# Patient Record
Sex: Female | Born: 1975 | Race: White | Hispanic: No | Marital: Single | Smoking: Former smoker
Health system: Southern US, Community
[De-identification: ages and names within clinical notes are randomized; demographics above are authoritative.]

## PROBLEM LIST (undated history)

## (undated) HISTORY — PX: TONSILLECTOMY: SUR1361

---

## 2017-03-23 ENCOUNTER — Emergency Department
Admission: EM | Admit: 2017-03-23 | Discharge: 2017-03-23 | Disposition: A | Discharge status: Home / Self Care | Payer: Self-pay | Attending: Emergency Medicine | Admitting: Emergency Medicine

## 2017-03-23 ENCOUNTER — Other Ambulatory Visit: Payer: Self-pay

## 2017-03-23 ENCOUNTER — Encounter: Payer: Self-pay | Admitting: Emergency Medicine

## 2017-03-23 ENCOUNTER — Emergency Department: Payer: Self-pay

## 2017-03-23 DIAGNOSIS — Z87891 Personal history of nicotine dependence: Secondary | ICD-10-CM | POA: Insufficient documentation

## 2017-03-23 DIAGNOSIS — N12 Tubulo-interstitial nephritis, not specified as acute or chronic: Secondary | ICD-10-CM | POA: Insufficient documentation

## 2017-03-23 LAB — URINALYSIS, COMPLETE (UACMP) WITH MICROSCOPIC
Bilirubin Urine: NEGATIVE
GLUCOSE, UA: NEGATIVE mg/dL
KETONES UR: NEGATIVE mg/dL
Nitrite: NEGATIVE
PROTEIN: NEGATIVE mg/dL
RBC / HPF: NONE SEEN RBC/hpf (ref 0–5)
Specific Gravity, Urine: 1.002 — ABNORMAL LOW (ref 1.005–1.030)
pH: 7 (ref 5.0–8.0)

## 2017-03-23 LAB — CBC
HEMATOCRIT: 38.6 % (ref 35.0–47.0)
HEMOGLOBIN: 13.2 g/dL (ref 12.0–16.0)
MCH: 31.6 pg (ref 26.0–34.0)
MCHC: 34.2 g/dL (ref 32.0–36.0)
MCV: 92.4 fL (ref 80.0–100.0)
Platelets: 215 10*3/uL (ref 150–440)
RBC: 4.18 MIL/uL (ref 3.80–5.20)
RDW: 12.6 % (ref 11.5–14.5)
WBC: 13.7 10*3/uL — ABNORMAL HIGH (ref 3.6–11.0)

## 2017-03-23 LAB — BASIC METABOLIC PANEL
ANION GAP: 11 (ref 5–15)
BUN: 8 mg/dL (ref 6–20)
CALCIUM: 8.9 mg/dL (ref 8.9–10.3)
CO2: 24 mmol/L (ref 22–32)
Chloride: 99 mmol/L — ABNORMAL LOW (ref 101–111)
Creatinine, Ser: 0.78 mg/dL (ref 0.44–1.00)
Glucose, Bld: 141 mg/dL — ABNORMAL HIGH (ref 65–99)
POTASSIUM: 3.9 mmol/L (ref 3.5–5.1)
Sodium: 134 mmol/L — ABNORMAL LOW (ref 135–145)

## 2017-03-23 LAB — POCT PREGNANCY, URINE: PREG TEST UR: NEGATIVE

## 2017-03-23 LAB — LACTIC ACID, PLASMA: LACTIC ACID, VENOUS: 0.7 mmol/L (ref 0.5–1.9)

## 2017-03-23 MED ORDER — TRAMADOL HCL 50 MG PO TABS: 50.0000 mg | ORAL_TABLET | Freq: Four times a day (QID) | ORAL | 0 refills | Status: AC | PRN

## 2017-03-23 MED ORDER — CIPROFLOXACIN HCL 500 MG PO TABS
500.0000 mg | ORAL_TABLET | Freq: Once | ORAL | Status: AC
  Administered 2017-03-23: 500 mg via ORAL

## 2017-03-23 MED ORDER — ACETAMINOPHEN 325 MG PO TABS
650.0000 mg | ORAL_TABLET | Freq: Once | ORAL | Status: AC | PRN
  Administered 2017-03-23: 650 mg via ORAL
  Filled 2017-03-23: qty 2

## 2017-03-23 MED ORDER — SODIUM CHLORIDE 0.9 % IV SOLN
1000.0000 mL | Freq: Once | INTRAVENOUS | Status: AC
  Administered 2017-03-23: 1000 mL via INTRAVENOUS

## 2017-03-23 MED ORDER — TRAMADOL HCL 50 MG PO TABS
ORAL_TABLET | ORAL | Status: AC
  Filled 2017-03-23: qty 1

## 2017-03-23 MED ORDER — DEXTROSE 5 % IV SOLN: 1.0000 g | Freq: Once | INTRAVENOUS | Status: DC

## 2017-03-23 MED ORDER — ONDANSETRON 4 MG PO TBDP: 4.0000 mg | ORAL_TABLET | Freq: Three times a day (TID) | ORAL | 0 refills | Status: AC | PRN

## 2017-03-23 MED ORDER — KETOROLAC TROMETHAMINE 30 MG/ML IJ SOLN
30.0000 mg | Freq: Once | INTRAMUSCULAR | Status: AC
  Administered 2017-03-23: 30 mg via INTRAVENOUS
  Filled 2017-03-23: qty 1

## 2017-03-23 MED ORDER — TRAMADOL HCL 50 MG PO TABS
50.0000 mg | ORAL_TABLET | Freq: Once | ORAL | Status: AC
  Administered 2017-03-23: 50 mg via ORAL

## 2017-03-23 MED ORDER — CIPROFLOXACIN HCL 500 MG PO TABS: 500.0000 mg | ORAL_TABLET | Freq: Two times a day (BID) | ORAL | 0 refills | Status: AC

## 2017-03-23 MED ORDER — CIPROFLOXACIN HCL 500 MG PO TABS
ORAL_TABLET | ORAL | Status: AC
  Filled 2017-03-23: qty 1

## 2017-03-23 NOTE — ED Notes (Signed)
Patient with complaint of fever, vomiting and bilateral lower back pain that started on Thursday. Patient denies any pain with urination. Patient reports vomiting times two today.

## 2017-03-23 NOTE — ED Provider Notes (Signed)
[facility] Emergency Department Provider Note   ____________________________________________    I have reviewed the triage vital signs and the nursing notes.   HISTORY  Chief Complaint Flank Pain     HPI Brittney Meadows is a 42 y.o. female who presents with complaints of flank pain and fever.  Patient reports flank pain began 2 or 3 days ago, she describes it as sharp and intermittent without radiation.  She has never had this before.  She denies dysuria.  No frequency.  No history of kidney stones.  Noted that she had fever today.  Occasional dry cough, no shortness of breath.  No rash.  No upper respiratory infection.  She reports mold in her household.  No vaginal discharge   History reviewed. No pertinent past medical history.  There are no active problems to display for this patient.   Past Surgical History:  Procedure Laterality Date  . TONSILLECTOMY      Prior to Admission medications   Medication Sig Start Date End Date Taking? Authorizing Provider  ciprofloxacin (CIPRO) 500 MG tablet Take 1 tablet (500 mg total) by mouth 2 (two) times daily for 10 days. 03/23/17 04/02/17  Jene EveryKinner, Demosthenes Virnig, MD  ondansetron (ZOFRAN ODT) 4 MG disintegrating tablet Take 1 tablet (4 mg total) by mouth every 8 (eight) hours as needed for nausea or vomiting. 03/23/17   Jene EveryKinner, Malikhi Ogan, MD  traMADol (ULTRAM) 50 MG tablet Take 1 tablet (50 mg total) by mouth every 6 (six) hours as needed. 03/23/17 03/23/18  Jene EveryKinner, Shemeika Starzyk, MD     Allergies Penicillins  History reviewed. No pertinent family history.  Social History Social History   Tobacco Use  . Smoking status: Former Games developermoker  . Smokeless tobacco: Never Used  Substance Use Topics  . Alcohol use: Yes  . Drug use: No    Review of Systems  Constitutional: Positive fever Eyes: No visual changes.  ENT: No sore throat. Cardiovascular: Denies chest pain. Respiratory: Denies shortness of breath. Gastrointestinal: As  above Genitourinary: Negative for dysuria. Musculoskeletal: Negative for back pain. Skin: Negative for rash. Neurological: Negative for headaches no neck pain   ____________________________________________   PHYSICAL EXAM:  VITAL SIGNS: ED Triage Vitals  Enc Vitals Group     BP 03/23/17 2022 126/79     Pulse Rate 03/23/17 2022 (!) 114     Resp 03/23/17 2022 (!) 22     Temp 03/23/17 2022 (!) 103 F (39.4 C)     Temp Source 03/23/17 2022 Oral     SpO2 03/23/17 2022 97 %     Weight --      Height --      Head Circumference --      Peak Flow --      Pain Score 03/23/17 2021 7     Pain Loc --      Pain Edu? --      Excl. in GC? --     Constitutional: Alert and oriented. No acute distress. Pleasant and interactive, well-appearing nontoxic, comfortable, sitting up in bed Eyes: Conjunctivae are normal.  Head: Atraumatic. Nose: No congestion/rhinnorhea. Mouth/Throat: Mucous membranes are moist.   Neck:  Painless ROM Cardiovascular: Tachycardia. Grossly normal heart sounds.  Good peripheral circulation. Respiratory: Normal respiratory effort.  No retractions. Lungs CTAB. Gastrointestinal: Soft and nontender. No distention.  ?  Mild right CVA tenderness Genitourinary: deferred Musculoskeletal:   Warm and well perfused Neurologic:  Normal speech and language. No gross focal neurologic deficits are appreciated.  Skin:  Skin is warm, dry and intact. No rash noted. Psychiatric: Mood and affect are normal. Speech and behavior are normal.  ____________________________________________   LABS (all labs ordered are listed, but only abnormal results are displayed)  Labs Reviewed  URINALYSIS, COMPLETE (UACMP) WITH MICROSCOPIC - Abnormal; Notable for the following components:      Result Value   Color, Urine STRAW (*)    APPearance CLEAR (*)    Specific Gravity, Urine 1.002 (*)    Hgb urine dipstick MODERATE (*)    Leukocytes, UA MODERATE (*)    Bacteria, UA RARE (*)    Squamous  Epithelial / LPF 0-5 (*)    All other components within normal limits  BASIC METABOLIC PANEL - Abnormal; Notable for the following components:   Sodium 134 (*)    Chloride 99 (*)    Glucose, Bld 141 (*)    All other components within normal limits  CBC - Abnormal; Notable for the following components:   WBC 13.7 (*)    All other components within normal limits  URINE CULTURE  LACTIC ACID, PLASMA  LACTIC ACID, PLASMA  POCT PREGNANCY, URINE  POC URINE PREG, ED   ____________________________________________  EKG  None ____________________________________________  RADIOLOGY  Chest x-ray CT renal stone study ____________________________________________   PROCEDURES  Procedure(s) performed: No  Procedures   Critical Care performed: No ____________________________________________   INITIAL IMPRESSION / ASSESSMENT AND PLAN / ED COURSE  Pertinent labs & imaging results that were available during my care of the patient were reviewed by me and considered in my medical decision making (see chart for details).  Patient quite well-appearing and in no acute distress considering that she has a fever and is mildly tachycardic.  Right flank pain strongly suspicious for pyelonephritis with her vital signs however urinalysis only shows moderate leukocytes.  Clinically does not appear septic however we will send a lactic given elevated white blood cell count.  Infected kidney stone is also a possibility although unlikely given how comfortable she is, no symptoms of influenza or pneumonia.  No rash, no headache or meningismus  We will treat with IV fluids and monitor closely  CT scan demonstrates stranding around the right kidney consistent with pyelonephritis, no stone.  Patient's vitals have improved, she feels markedly better.  Will treat with p.o. antibiotics, analgesics.  Strict return precautions, culture sent with  ____________________________________________   FINAL CLINICAL  IMPRESSION(S) / ED DIAGNOSES  Final diagnoses:  Pyelonephritis        Note:  This document was prepared using Dragon voice recognition software and may include unintentional dictation errors.    Jene Every, MD 03/23/17 2308

## 2017-03-23 NOTE — ED Notes (Signed)
E-signature pad not working. Paper copy discharge signature obtained.

## 2017-03-23 NOTE — ED Triage Notes (Signed)
Patient states she has had right back pain for 2 weeks, and today presents with a 103 Oral temp, 114P.  She has also had night sweats last 2 nights and woke up drenched.

## 2017-03-26 LAB — URINE CULTURE: Culture: >=100000 — AB

## 2018-05-19 IMAGING — CT CT RENAL STONE PROTOCOL
3 of 4 series · 10 of 46 positions shown, 15 images · non-contrast
Comparison: None.

CLINICAL DATA: Patient with complaint of fever, vomiting and
bilateral lower back pain that started on [REDACTED]. Patient denies
any pain with urination. Patient reports vomiting times two today.

EXAM:
CT ABDOMEN AND PELVIS WITHOUT CONTRAST
TECHNIQUE: Multidetector CT imaging of the abdomen and pelvis was performed
following the standard protocol without IV contrast.

[Series 4: lung bases · axial · 0.70mm/px · z∈[-1040,-940]mm · 6 of 30 slices shown, 11 images]
[im 5/30  soft-tissue]
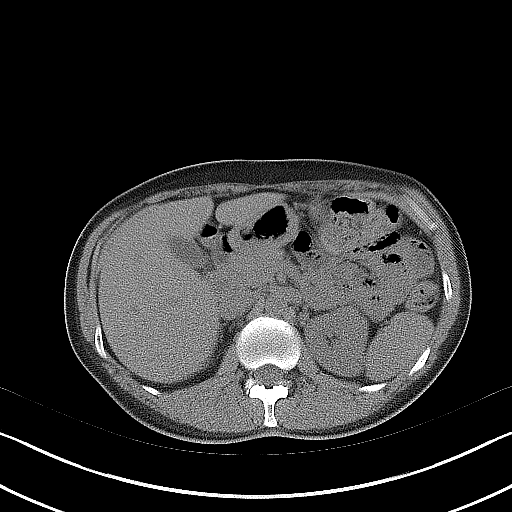
[im 5/30  bone]
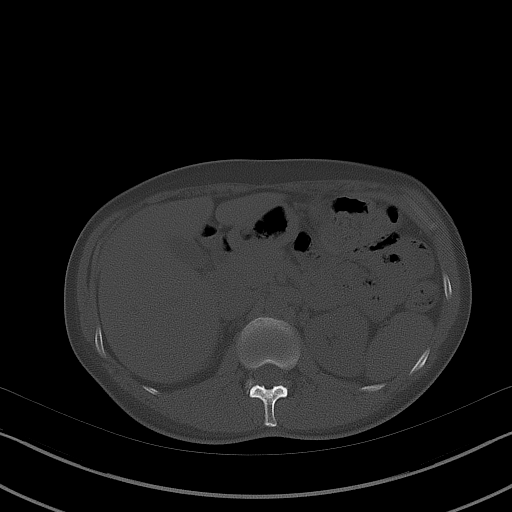
[im 9/30  soft-tissue]
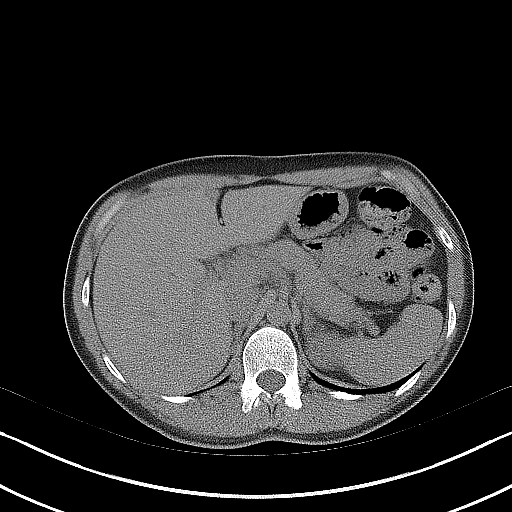
[im 13/30  soft-tissue]
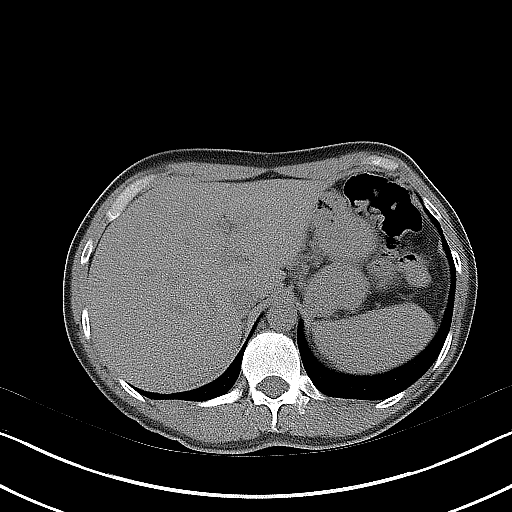
[im 13/30  lung]
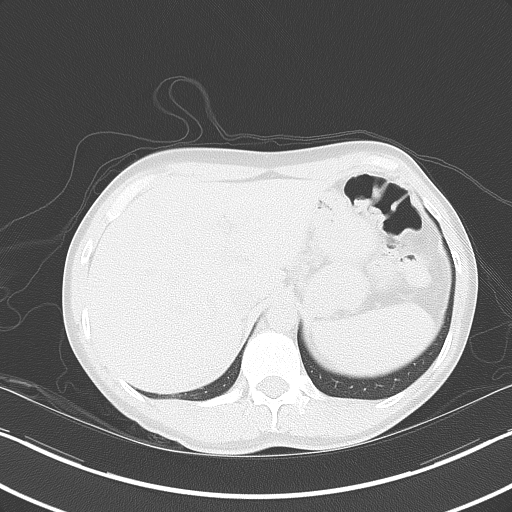
[im 17/30  soft-tissue]
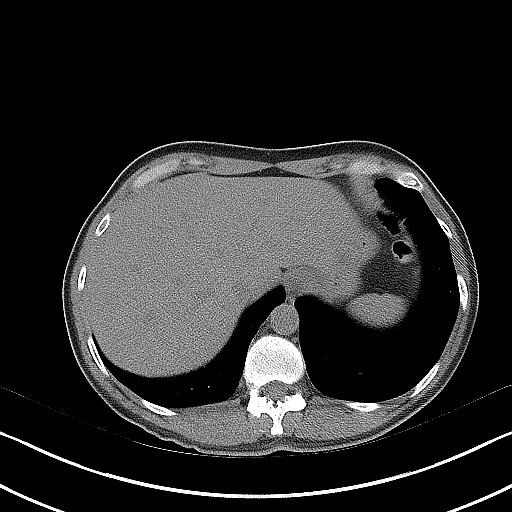
[im 17/30  lung]
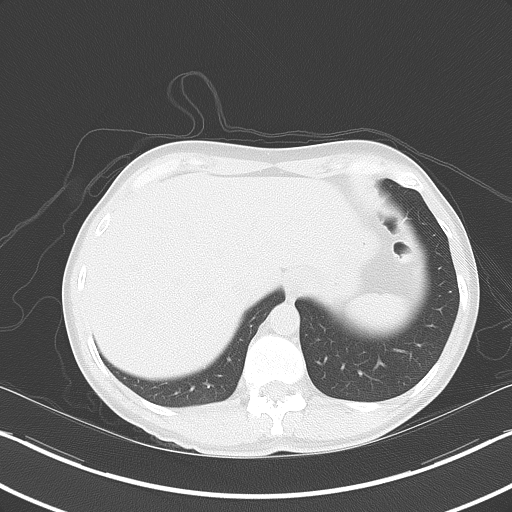
[im 21/30  soft-tissue]
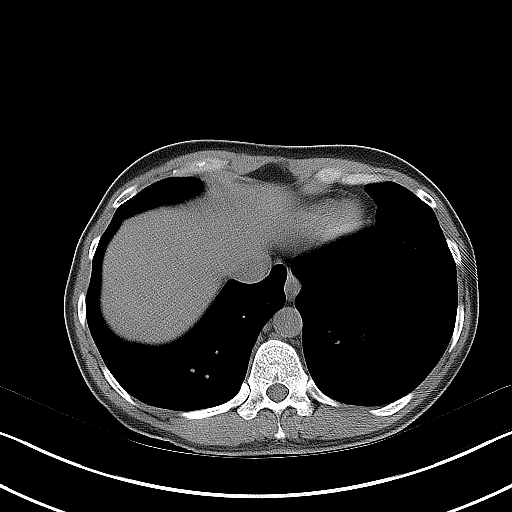
[im 21/30  lung]
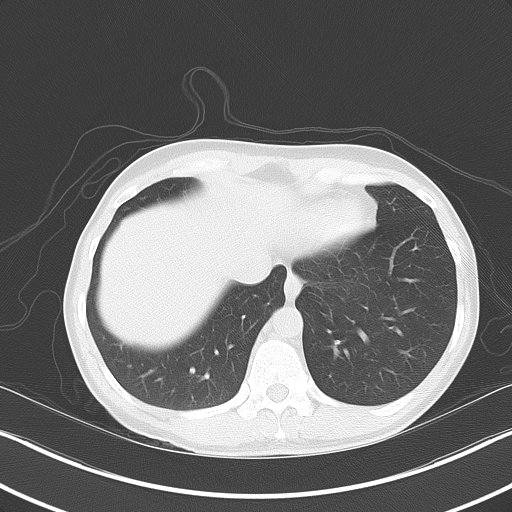
[im 25/30  soft-tissue]
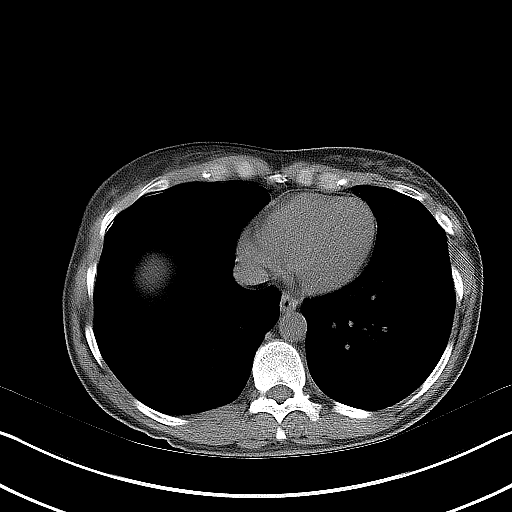
[im 25/30  lung]
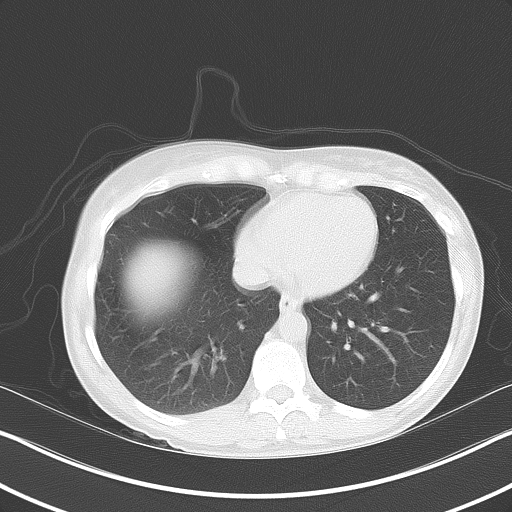

[Series 5: coronal · coronal · 0.58mm/px · 3 of 108 slices shown]
[im 36/108  soft-tissue]
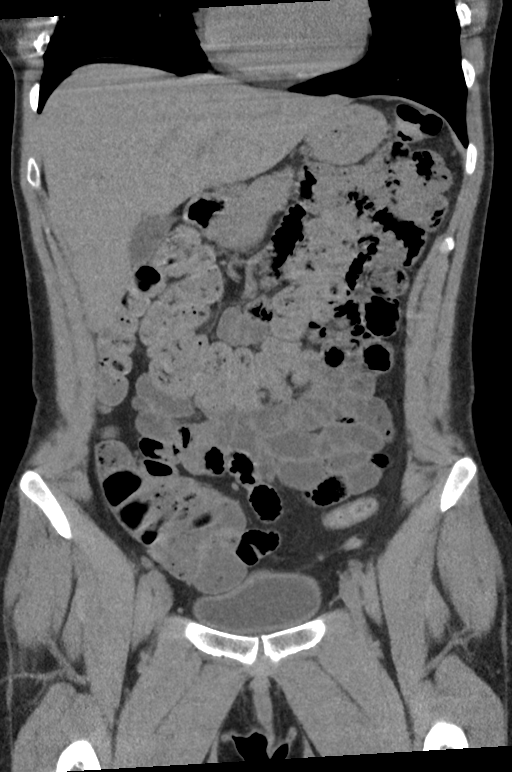
[im 48/108  soft-tissue]
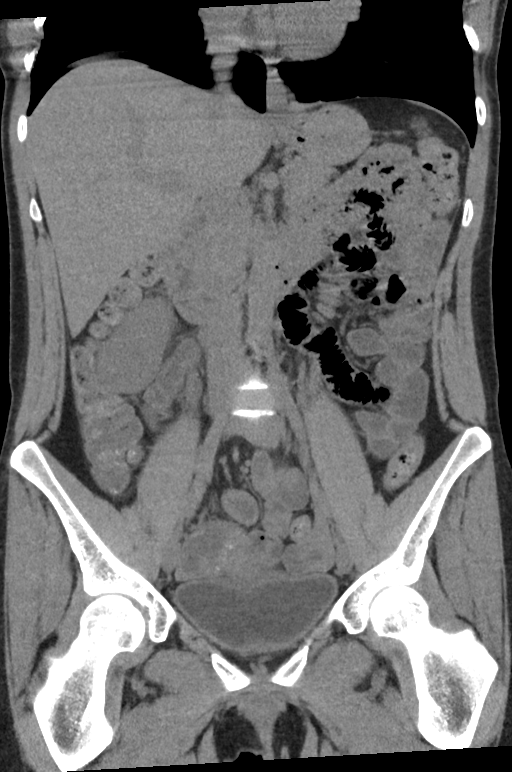
[im 60/108  soft-tissue]
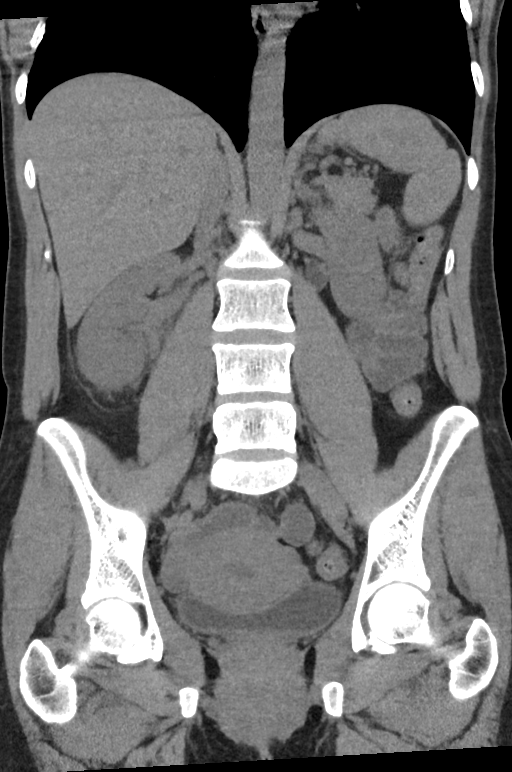

[Series 6: sagittal · sagittal · 0.42mm/px · 1 of 149 slices shown]
[im 50/149  soft-tissue]
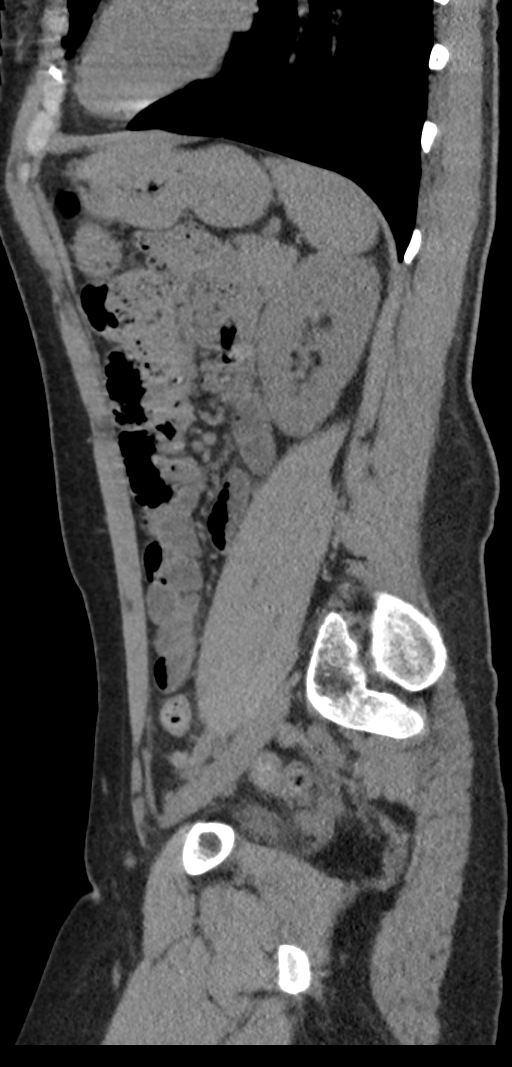

[10 of 46 positions shown; findings below may reference images not displayed]

FINDINGS: Lower chest: Lung bases are unremarkable.  Heart size is normal.

Hepatobiliary: No focal liver abnormality is seen. No radiopaque
gallstones, biliary dilatation, or pericholecystic inflammatory
changes.

Pancreas: Unremarkable. No pancreatic ductal dilatation or
surrounding inflammatory changes.All

Spleen: Normal in size without focal abnormality.

Adrenals/Urinary Tract: Adrenal glands are normal.

The right kidney is enlarged and low-attenuation. There is right
perinephric stranding. There is inflammatory change surrounding the
right renal pelvis and proximal ureter. There is a single punctate 1
mm stone within the midpole region of the right kidney but there are
no ureteral stones.

The left kidney and ureter are unremarkable in appearance. The
urinary bladder is normal in appearance.

Stomach/Bowel: The stomach and small bowel loops are normal in
appearance. The appendix is well seen and has a normal appearance.

Vascular/Lymphatic: No significant vascular findings are present. No
enlarged abdominal or pelvic lymph nodes.

Reproductive: The uterus is present.  No adnexal mass.

Other: No free pelvic fluid. Anterior abdominal wall is
unremarkable.

Musculoskeletal: No acute or significant osseous findings.
IMPRESSION: 1. Enlarged right kidney with perinephric stranding but no evidence
for obstruction. The findings are consistent with pyelonephritis. A
similar appearance can be seen with renal vein thrombosis but the
right renal vein does not appear enlarged or abnormal.
2. Punctate, nonobstructing intrarenal calculus in the midpole
region of the right kidney.
3. Normal appendix.

## 2018-05-19 IMAGING — DX DG CHEST 1V PORT
1 series · 1 of 1 positions shown · non-contrast
Comparison: None.

CLINICAL DATA: Fever, vomiting, and flank/lower back pain.

EXAM:
PORTABLE CHEST 1 VIEW

[chest ap]
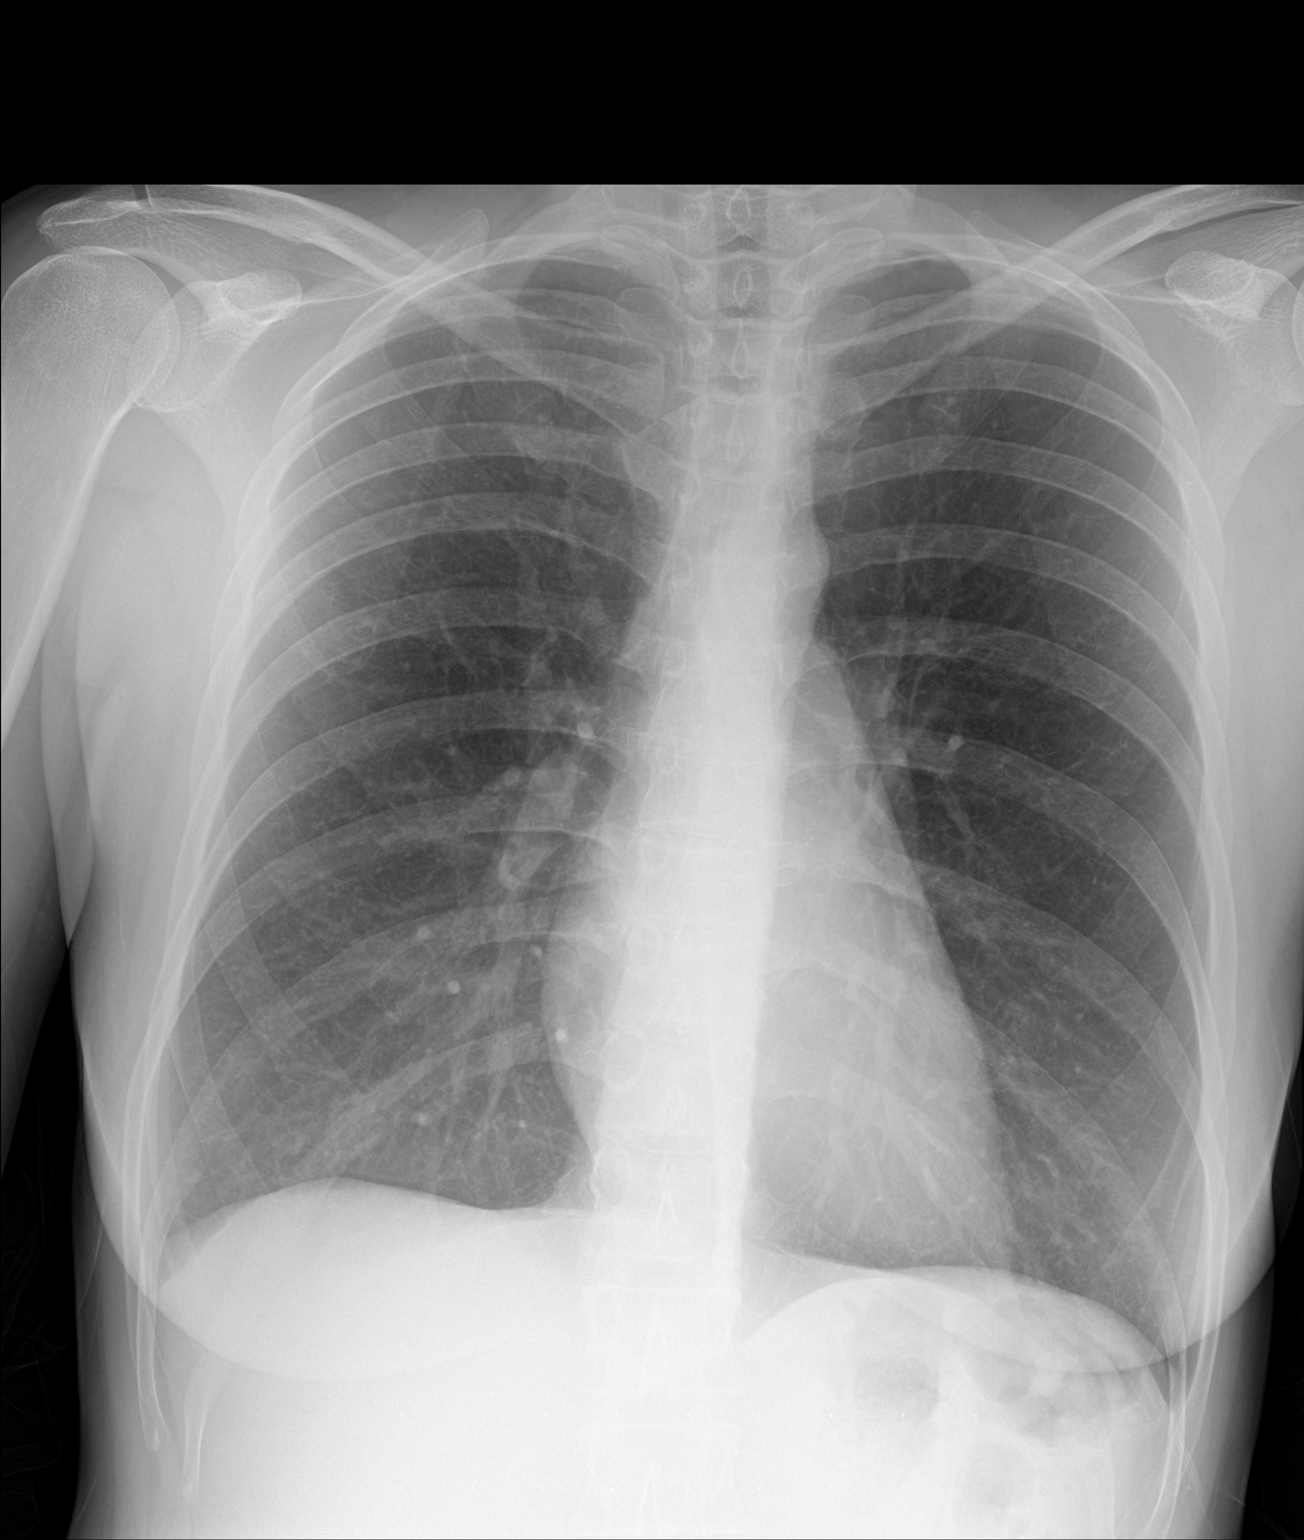

[1 of 1 positions shown; findings below may reference images not displayed]

FINDINGS: The cardiomediastinal silhouette is within normal limits. The lungs
are well inflated and clear. There is no evidence of pleural
effusion or pneumothorax. No acute osseous abnormality is
identified.
IMPRESSION: No active disease.
